# Patient Record
Sex: Female | Born: 1940 | Race: White | Hispanic: No | Marital: Single
Health system: Southern US, Community
[De-identification: ages and names within clinical notes are randomized; demographics above are authoritative.]

---

## 2006-01-10 ENCOUNTER — Emergency Department: Payer: Self-pay | Admitting: Emergency Medicine

## 2006-01-10 ENCOUNTER — Other Ambulatory Visit: Payer: Self-pay

## 2006-11-30 ENCOUNTER — Ambulatory Visit: Payer: Self-pay | Admitting: Cardiovascular Disease

## 2009-05-18 ENCOUNTER — Inpatient Hospital Stay: Payer: Self-pay | Admitting: Internal Medicine

## 2009-05-22 ENCOUNTER — Inpatient Hospital Stay: Payer: Self-pay | Admitting: Internal Medicine

## 2009-06-10 ENCOUNTER — Emergency Department: Payer: Self-pay | Admitting: Emergency Medicine

## 2009-07-13 ENCOUNTER — Ambulatory Visit: Payer: Self-pay | Admitting: Dermatology

## 2009-09-29 ENCOUNTER — Ambulatory Visit: Payer: Self-pay | Admitting: Internal Medicine

## 2009-10-16 ENCOUNTER — Emergency Department: Payer: Self-pay | Admitting: Emergency Medicine

## 2009-10-17 ENCOUNTER — Ambulatory Visit: Payer: Self-pay | Admitting: Internal Medicine

## 2009-10-30 ENCOUNTER — Ambulatory Visit: Payer: Self-pay | Admitting: Internal Medicine

## 2009-11-29 ENCOUNTER — Ambulatory Visit: Payer: Self-pay | Admitting: Internal Medicine

## 2010-01-20 ENCOUNTER — Inpatient Hospital Stay: Payer: Self-pay | Admitting: Internal Medicine

## 2010-07-10 ENCOUNTER — Other Ambulatory Visit: Payer: Self-pay | Admitting: Geriatric Medicine

## 2010-09-05 ENCOUNTER — Other Ambulatory Visit: Payer: Self-pay | Admitting: Geriatric Medicine

## 2010-10-15 ENCOUNTER — Ambulatory Visit: Payer: Self-pay

## 2010-10-17 ENCOUNTER — Ambulatory Visit: Payer: Self-pay

## 2011-04-01 ENCOUNTER — Emergency Department: Payer: Self-pay | Admitting: Internal Medicine

## 2011-04-01 ENCOUNTER — Ambulatory Visit: Payer: Self-pay | Admitting: Ophthalmology

## 2011-04-11 ENCOUNTER — Ambulatory Visit: Payer: Self-pay | Admitting: Ophthalmology

## 2011-05-13 ENCOUNTER — Other Ambulatory Visit: Payer: Self-pay | Admitting: Geriatric Medicine

## 2011-06-03 ENCOUNTER — Ambulatory Visit: Payer: Self-pay | Admitting: Geriatric Medicine

## 2011-06-04 ENCOUNTER — Other Ambulatory Visit: Payer: Self-pay | Admitting: Geriatric Medicine

## 2011-06-08 ENCOUNTER — Ambulatory Visit: Payer: Self-pay | Admitting: Geriatric Medicine

## 2011-07-06 ENCOUNTER — Other Ambulatory Visit: Payer: Self-pay | Admitting: Geriatric Medicine

## 2011-07-16 ENCOUNTER — Inpatient Hospital Stay: Payer: Self-pay | Admitting: Internal Medicine

## 2011-08-02 DEATH — deceased

## 2012-04-13 IMAGING — XA IR VASCULAR PROCEDURE
3 series · 3 of 3 positions shown · non-contrast
Comparison: none

[Series 1: venogram · 1 of 1 slices shown]
[im 1/1]
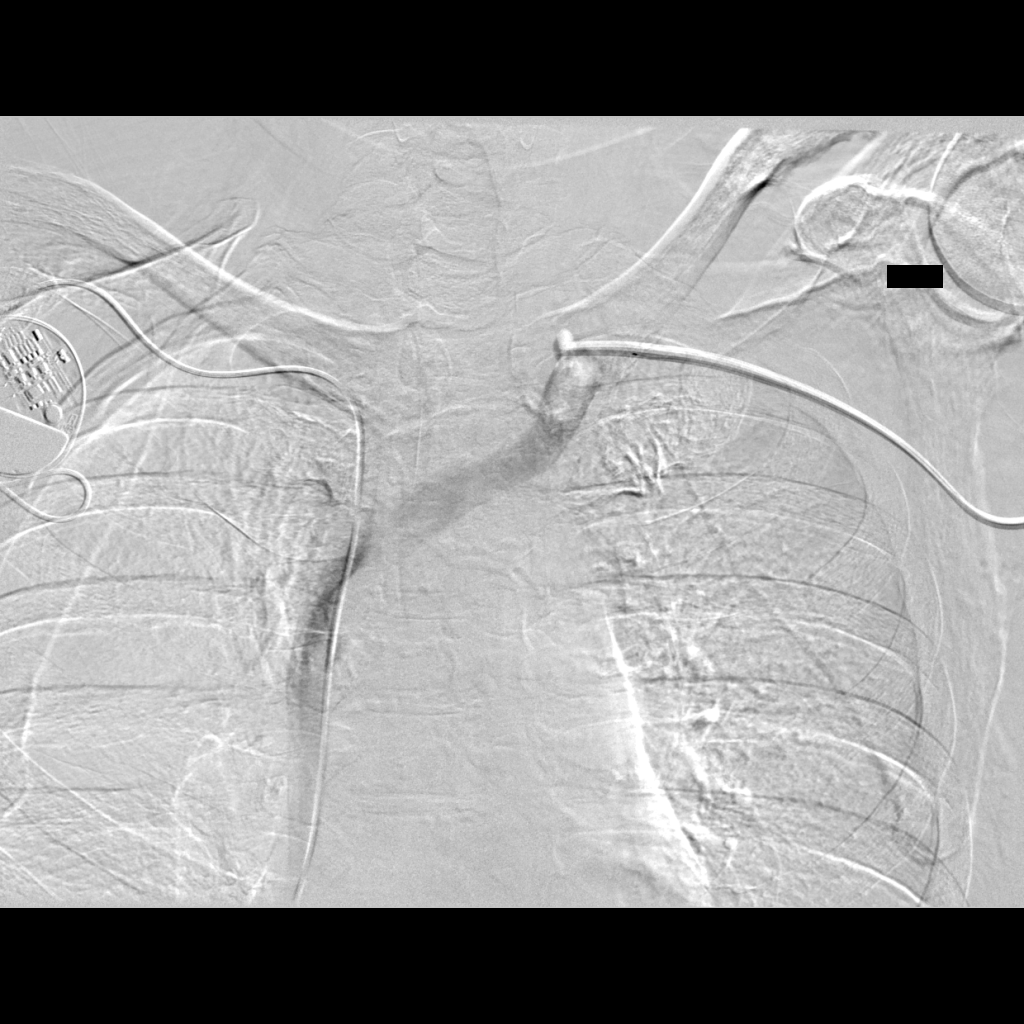

[Series 2: fl - angio · 1 of 1 slices shown]
[im 1/1]
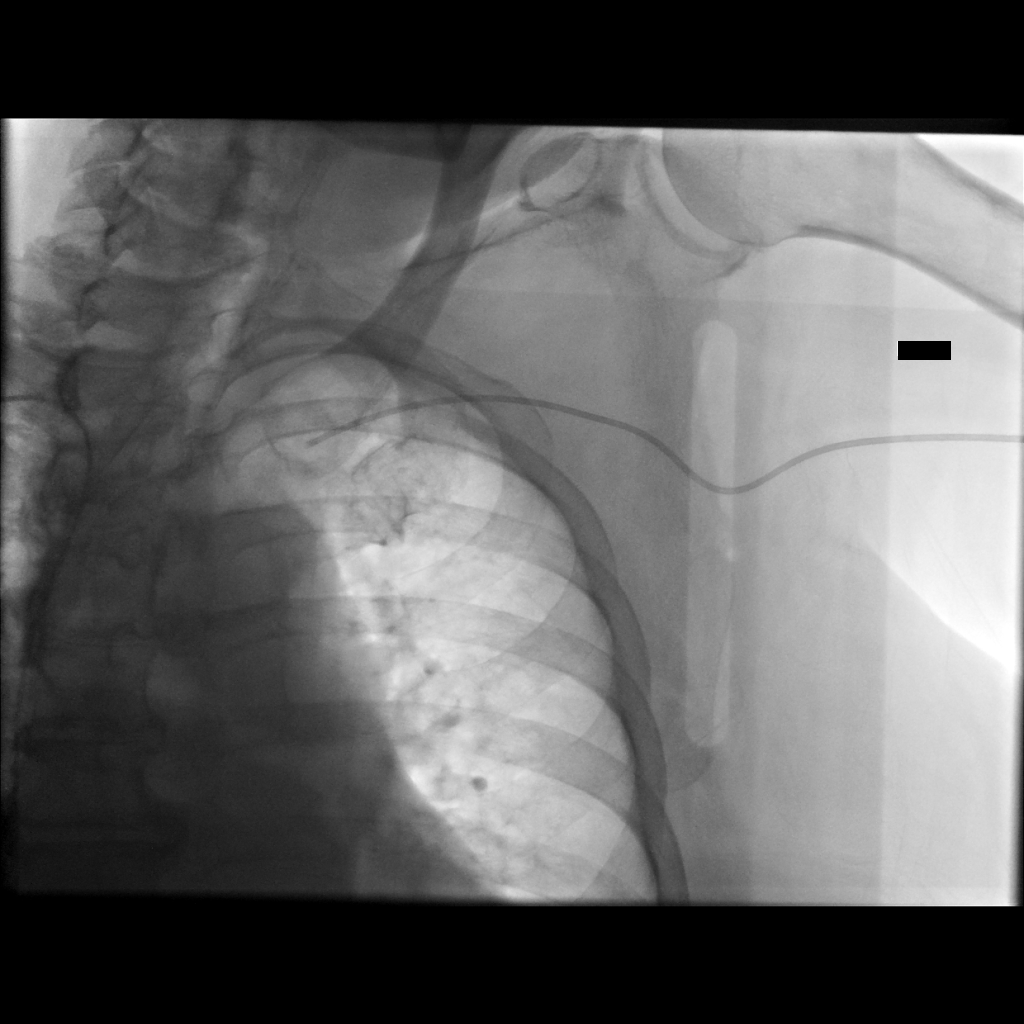

[Series 3: single · 1 of 1 slices shown]
[im 1/1]
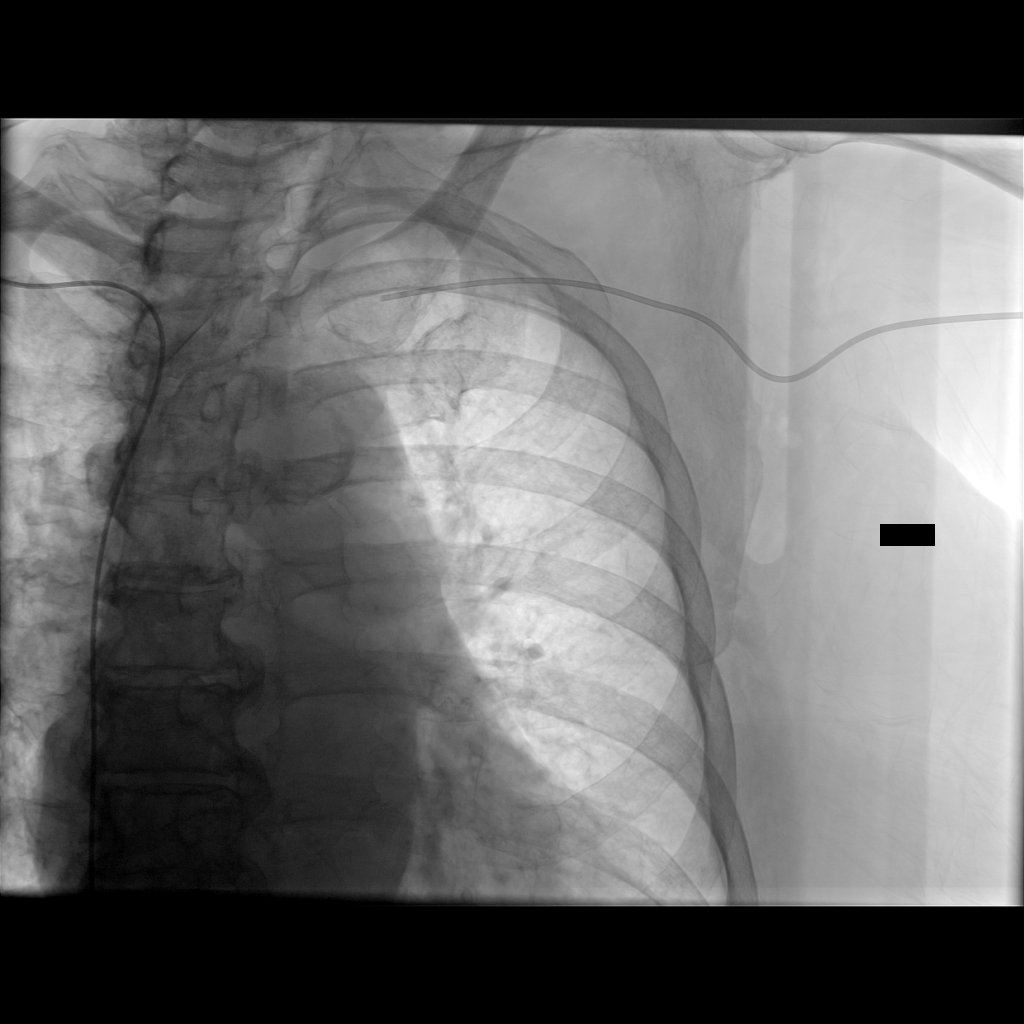

[3 of 3 positions shown; findings below may reference images not displayed]

IMAGES IMPORTED FROM THE [employer] WORKFLOW SYSTEM
NO DICTATION FOR STUDY

## 2012-05-21 IMAGING — CT CT HEAD WITHOUT CONTRAST
2 series · 16 of 30 positions shown, 20 images · non-contrast
Comparison: none

REASON FOR EXAM: AMS
COMMENTS:   May transport without cardiac monitor

PROCEDURE:     CT  - CT HEAD WITHOUT CONTRAST  - July 16, 2011  [DATE]
RESULT:     Comparison is made to a prior study dated 03/22/2010.
TECHNIQUE: Helical noncontrasted 5 mm sections were obtained from skull base
through the vertex.

[Series 2: without · axial · non-contrast · 0.42mm/px · z∈[+267,+392]mm · 13 of 45 slices shown, 17 images]
[im 4/45  brain]
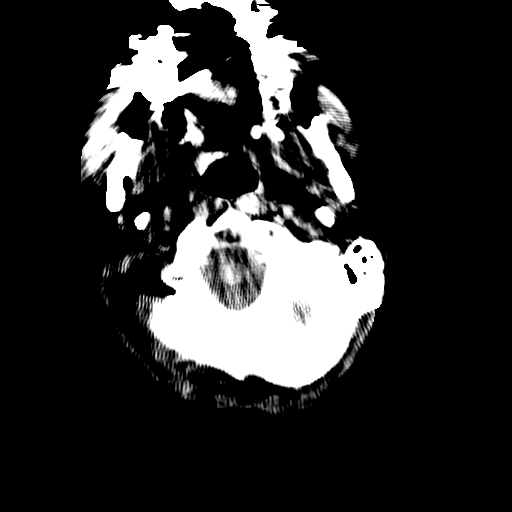
[im 4/45  bone]
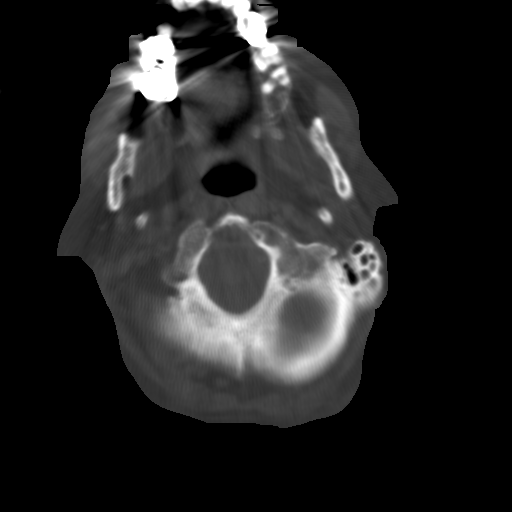
[im 7/45  brain]
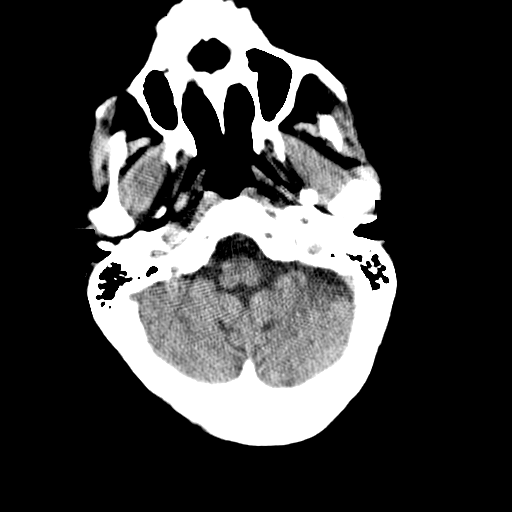
[im 10/45  brain]
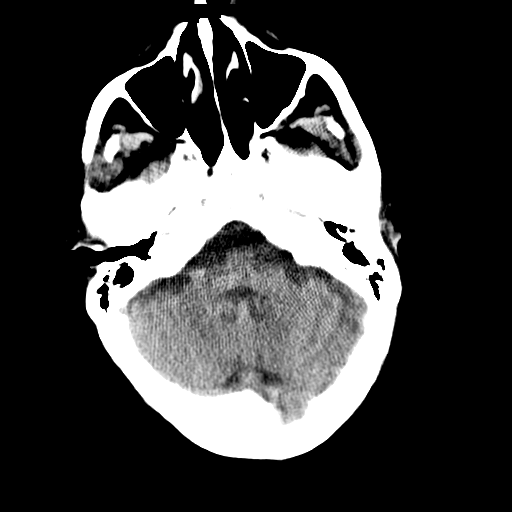
[im 13/45  brain]
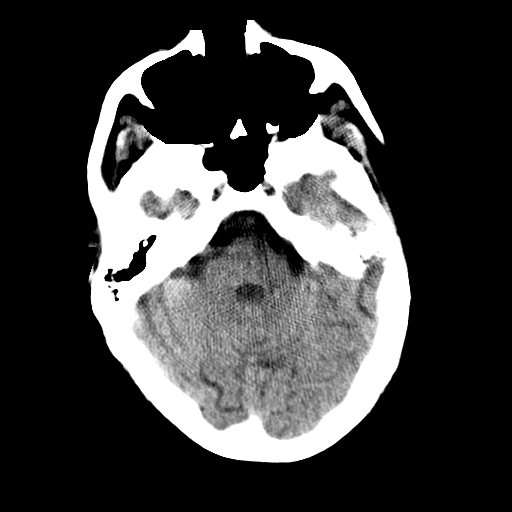
[im 16/45  brain]
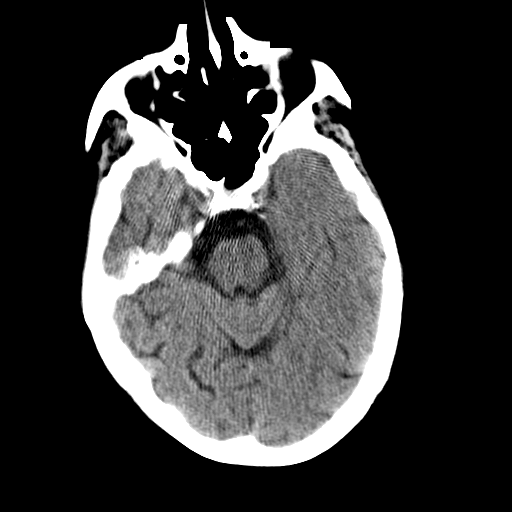
[im 16/45  bone]
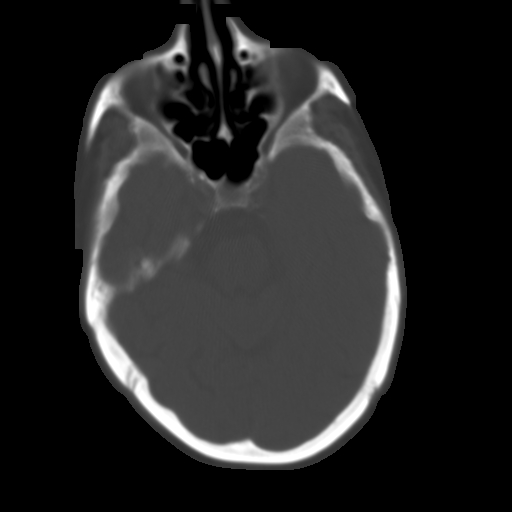
[im 19/45  brain]
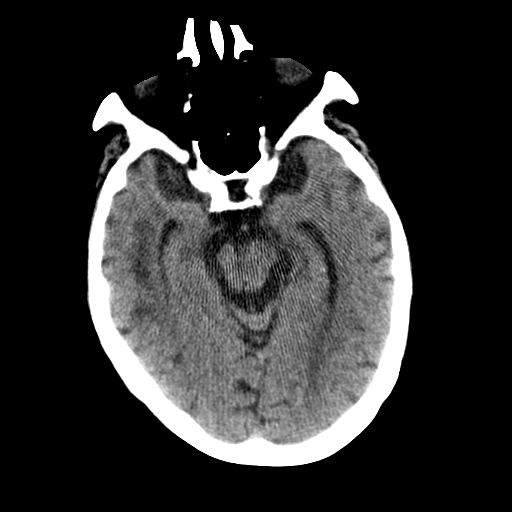
[im 23/45  brain]
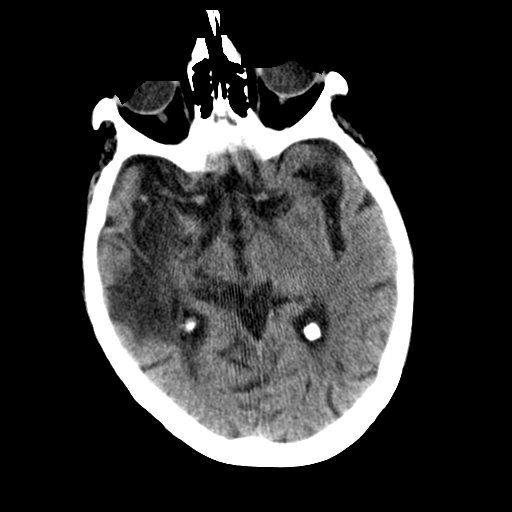
[im 26/45  brain]
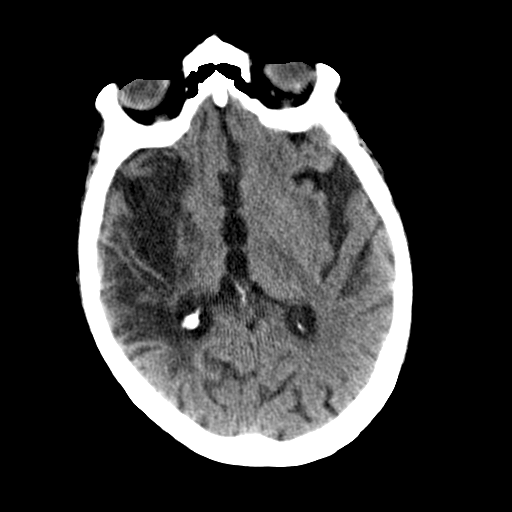
[im 29/45  brain]
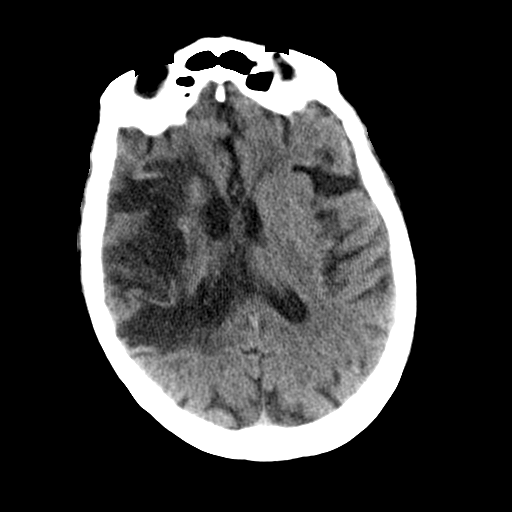
[im 29/45  bone]
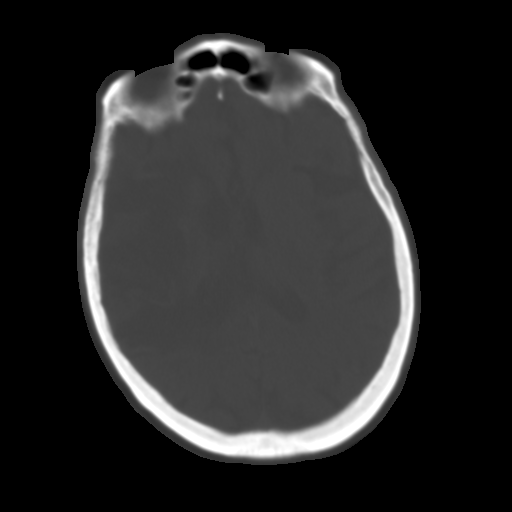
[im 32/45  brain]
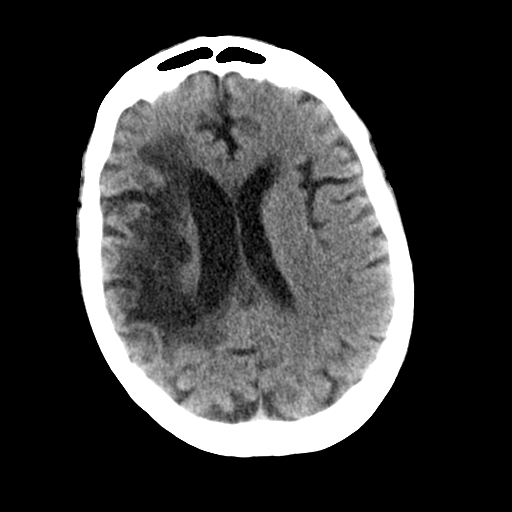
[im 35/45  brain]
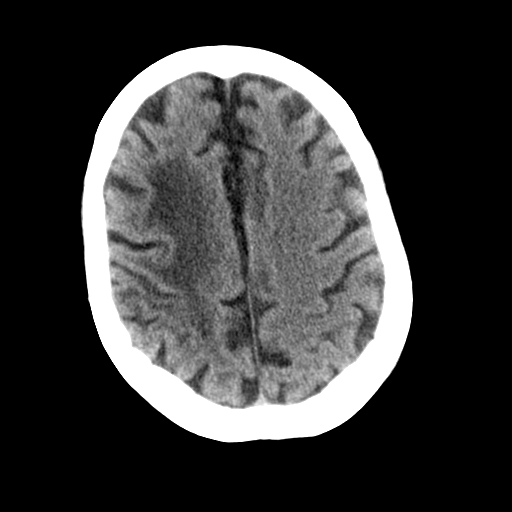
[im 38/45  brain]
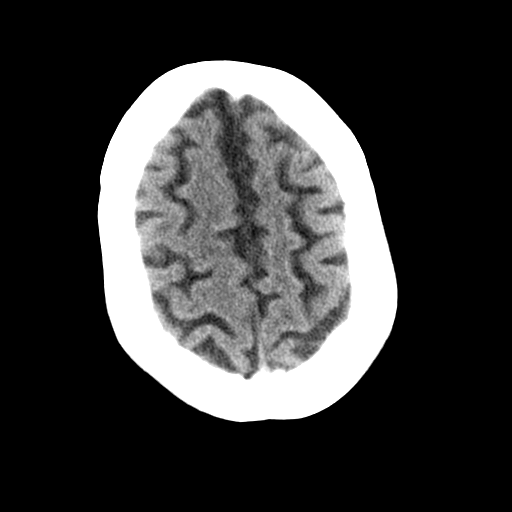
[im 41/45  brain]
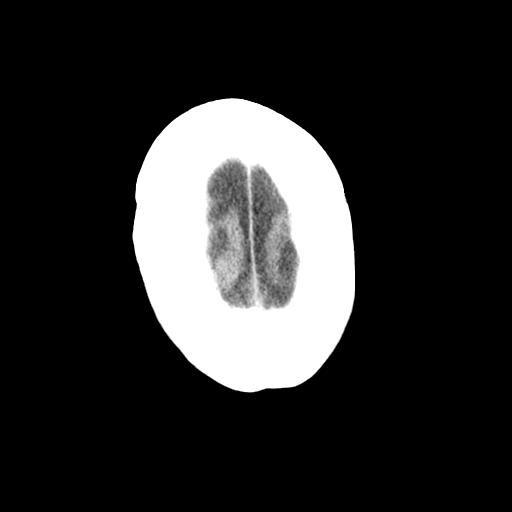
[im 41/45  bone]
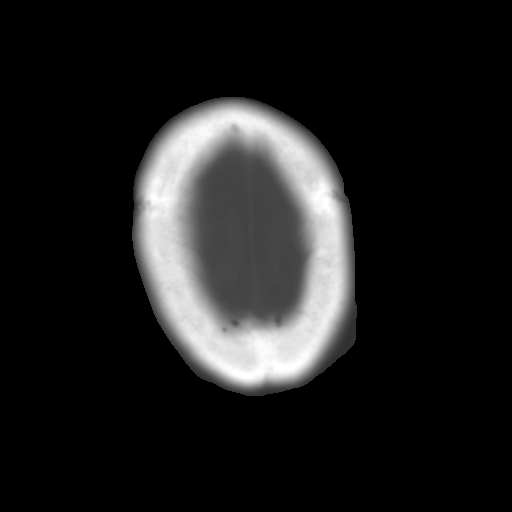

[Series 3: bone · axial · 0.42mm/px · z∈[+267,+307]mm · 3 of 45 slices shown]
[im 4/45  bone]
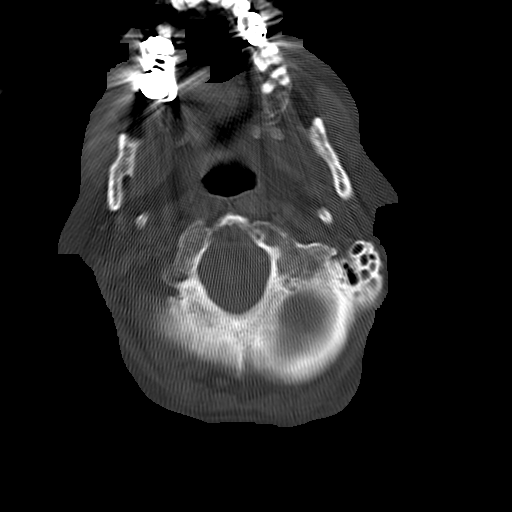
[im 10/45  bone]
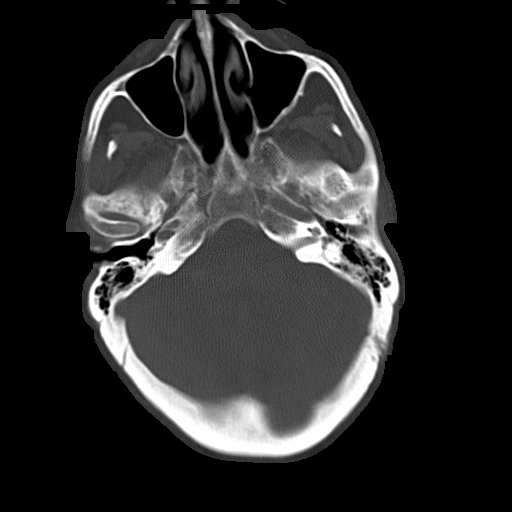
[im 16/45  bone]
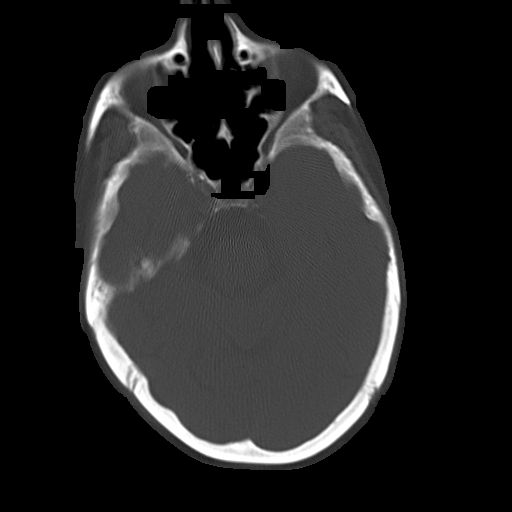

[16 of 30 positions shown; findings below may reference images not displayed]

FINDINGS: There is diffuse cortical atrophy and areas of low attenuation
within the subcortical, deep and periventricular white matter regions. An
area of encephalomalacia is appreciated involving the majority of the right
temporal lobe. The ventricles and cisterns are patent. There is no evidence
of subfalcine or tonsillar herniation. There is no evidence of a depressed
skull fracture.
IMPRESSION: Chronic and involutional changes without evidence of acute abnormalities.

## 2014-11-23 NOTE — Discharge Summary (Signed)
PATIENT NAME:  Barbara Brandt, Hana MR#:  [id number] DATE OF BIRTH:  05/11/1941  DATE OF ADMISSION:  07/16/2011 DATE OF DISCHARGE:  07/17/2011  PRIMARY CARE PHYSICIAN: Dr. Benito MccreedyMaria Sevilla   REASON FOR ADMISSION: Altered mental status.   DISCHARGE DIAGNOSES:  1. Altered mental status and confusion likely multifactorial from metabolic encephalopathy with hypernatremia from dehydration, acute renal failure plus sepsis/bacteremia.  2. Hypernatremia, suspect hypovolemic. 3. Atrial fibrillation with rapid ventricular response with history of permanent pacemaker insertion. 4. Sepsis with gram-positive bacteremia. 5. Acute renal failure.  6. Dehydration. 7. Acquired coagulopathy/supratherapeutic INR. 8. History of bullous pemphigoid.  9. History of cerebrovascular accident with residual left-sided deficit.  10. History of gastroesophageal reflux disease. 11. History of chronic diastolic congestive heart failure. 12. Leukocytosis.  13. Left upper extremity cellulitis.   CONSULTATIONS: None.   LABORATORY, DIAGNOSTIC AND RADIOLOGICAL DATA:  Portable chest x-ray 07/15/2011: Shallow inspiration without focal regions of consolidation or focal infiltrates.   Noncontrast head CT 07/16/2011: Chronic and involutional changes without evidence of acute abnormalities.   BUN 60, creatinine 2.4 on admission. BUN 56, creatinine 1.66 on the day of discharge.   Serum sodium greater than 160 on admission and greater than 160 on the day of discharge.   TSH 4.52 on admission and 3.10 on the day of discharge.   CBC on admission normal except for WBC 14.6, WBC 16.1 on the day of discharge.   Urine culture from 07/15/2011: No growth to date.  Blood cultures x2 from 07/16/2011: Gram-positive cocci in clusters. One bottle growth of staph aureus, second bottle growth of staph aureus and Enterococcus faecalis.   EKG with atrial fibrillation with rapid ventricular response on admission, heart rate 149 beats per minute.    INR was 6.1 on admission and 1.9 on the day of discharge.   BRIEF HISTORY/HOSPITAL COURSE: Patient is a 74 year old female with past medical history of hypertension, chronic atrial fibrillation status post permanent pacemaker insertion who is on anticoagulation, history of cerebrovascular accident with residual left hemiparesis, bullous pemphigoid, chronic diastolic congestive heart failure, gastroesophageal reflux disease, obstructive sleep apnea who presented to the Emergency Department with complaints of altered mental status. Please see dictated admission history and physical for pertinent details surrounding the onset of this hospitalization. Please see below for further details.  1. Confusion and altered mental status-Patient presented in a critically ill state and was confused. Was found to have multiple lab abnormalities. Her confusion/altered mental status was felt to be multifactorial from metabolic encephalopathy with hypernatremia felt to be from dehydration and volume depletion and she also had acute renal failure and was noted to be septic and bacteremic. Noncontrast head CT was obtained on admission which did not reveal acute intracranial abnormalities. Patient was admitted to Critical Care Unit and blood cultures were obtained and she was started on empiric IV antibiotics for sepsis and also started on IV fluids for dehydration and hyponatremia and acute renal failure. She was noted to have supratherapeutic INR from Coumadin therapy and Coumadin was held and she was given a dose of vitamin K. Blood cultures returned positive for gram-positive cocci in clusters and had not been finalized by the patient was discharged to the hospice home. Her sepsis and gram-positive bacteremia was felt to be from superinfection from her skin disease and bullous pemphigoid as many of her lesions had ruptured and she had diffuse lesions throughout her entire body. She was maintained on broad-spectrum IV  antibiotics and also kept on IV fluids.  2. Atrial  fibrillation with rapid ventricular response status post permanent pacemaker insertion she was placed on a Cardizem drip for heart rate control with caution given her hypotension. Coumadin was held given supratherapeutic INR as above.  3. She was also noted to have bullous pemphigoid as above with probable superinfection from ruptured skin lesions and she also had left upper extremity cellulitis and she was maintained on IV vancomycin and Zosyn. She was also maintained on steroids for bullous pemphigoid. Leukocytosis was felt to be steroid-induced as she is on chronic prednisone therapy but leukocytosis could also be from sepsis and bacteremia. She was afebrile on presentation. She was maintained on steroids and IV antibiotics and her WBC count is slightly improved. With the measures mentioned above with IV fluids, IV antibiotics, Cardizem drip and supportive care there is essentially no change in patient's overall clinical condition. She continued to remain obtunded with altered mental status. On 07/17/2011 she did not respond to verbal stimuli but withdrew to tactile stimuli. Her overall prognosis is felt to be very grave and likely terminal. Patient's overall clinical condition and poor prognosis was discussed with her daughter, Clydene Burack and upon further discussion daughter decided to change the patient's CODE STATUS to DO NOT RESUSCITATE and was interested in comfort care measures. Thereafter, patient was placed on comfort care measures and she was felt to be appropriate for discharge to the hospice home for end-of-life care. On 07/17/2011 patient was in critically ill condition but stable and was felt to be stable for discharge to hospice home.   DISCHARGE DISPOSITION: Hospice home.   DISCHARGE CONDITION: Unimproved, critically ill but stable. Poor overall prognosis, likely terminal.   DISCHARGE ACTIVITY: As tolerated.   DISCHARGE DIET: As  tolerated.   DISCHARGE MEDICATIONS:  1. Roxanol 20 mg/mL 0.5 to 1 mL p.o. or sublingually every 1 to 2 hours p.r.n. pain or dyspnea until relief.  2. Ativan 0.5 to 1 mg p.o. or sublingually q.2-4 hours p.r.n. agitation or anxiety.      3. ABHR suppository 1 PR every 4 to 6 hours p.r.n. nonobstructed refractory nausea and vomiting.   ____________________________ Elon Alas, MD knl:cms D: 07/21/2011 19:33:47 ET T: 07/22/2011 11:57:58 ET JOB#: [id number]  cc: Elon Alas, MD, <Dictator> Glenetta Borg, MD [facility] Elon Alas MD ELECTRONICALLY SIGNED 08/04/2011 16:23
